# Patient Record
Sex: Male | Born: 1965 | Race: White | Hispanic: No | Marital: Married | Smoking: Current every day smoker
Health system: Southern US, Community
[De-identification: ages and names within clinical notes are randomized; demographics above are authoritative.]

## PROBLEM LIST (undated history)

## (undated) DIAGNOSIS — N200 Calculus of kidney: Secondary | ICD-10-CM

## (undated) DIAGNOSIS — I82409 Acute embolism and thrombosis of unspecified deep veins of unspecified lower extremity: Secondary | ICD-10-CM

## (undated) HISTORY — PX: ROTATOR CUFF REPAIR: SHX139

---

## 1998-08-07 ENCOUNTER — Emergency Department (HOSPITAL_COMMUNITY): Admission: EM | Admit: 1998-08-07 | Discharge: 1998-08-07 | Payer: Self-pay | Admitting: Emergency Medicine

## 1999-10-18 ENCOUNTER — Emergency Department (HOSPITAL_COMMUNITY): Admission: EM | Admit: 1999-10-18 | Discharge: 1999-10-18 | Payer: Self-pay | Admitting: *Deleted

## 1999-10-23 ENCOUNTER — Encounter: Payer: Self-pay | Admitting: Emergency Medicine

## 1999-10-23 ENCOUNTER — Observation Stay (HOSPITAL_COMMUNITY): Admission: EM | Admit: 1999-10-23 | Discharge: 1999-10-24 | Payer: Self-pay | Admitting: Emergency Medicine

## 2000-04-12 ENCOUNTER — Encounter: Payer: Self-pay | Admitting: Family Medicine

## 2000-04-12 ENCOUNTER — Ambulatory Visit (HOSPITAL_COMMUNITY): Admission: RE | Admit: 2000-04-12 | Discharge: 2000-04-12 | Payer: Self-pay | Admitting: Family Medicine

## 2001-04-09 ENCOUNTER — Encounter: Admission: RE | Admit: 2001-04-09 | Discharge: 2001-04-09 | Payer: Self-pay | Admitting: Urology

## 2001-04-09 ENCOUNTER — Encounter: Payer: Self-pay | Admitting: Urology

## 2001-04-14 ENCOUNTER — Encounter: Payer: Self-pay | Admitting: Emergency Medicine

## 2001-04-14 ENCOUNTER — Emergency Department (HOSPITAL_COMMUNITY): Admission: EM | Admit: 2001-04-14 | Discharge: 2001-04-15 | Payer: Self-pay | Admitting: Emergency Medicine

## 2002-07-29 ENCOUNTER — Emergency Department (HOSPITAL_COMMUNITY): Admission: EM | Admit: 2002-07-29 | Discharge: 2002-07-29 | Payer: Self-pay | Admitting: Emergency Medicine

## 2003-04-25 ENCOUNTER — Ambulatory Visit (HOSPITAL_BASED_OUTPATIENT_CLINIC_OR_DEPARTMENT_OTHER): Admission: RE | Admit: 2003-04-25 | Discharge: 2003-04-25 | Payer: Self-pay | Admitting: Family Medicine

## 2003-09-22 ENCOUNTER — Ambulatory Visit (HOSPITAL_COMMUNITY): Admission: RE | Admit: 2003-09-22 | Discharge: 2003-09-22 | Payer: Self-pay | Admitting: Neurological Surgery

## 2004-03-06 ENCOUNTER — Ambulatory Visit (HOSPITAL_COMMUNITY): Admission: RE | Admit: 2004-03-06 | Discharge: 2004-03-06 | Payer: Self-pay | Admitting: Emergency Medicine

## 2004-04-21 ENCOUNTER — Ambulatory Visit (HOSPITAL_COMMUNITY): Admission: RE | Admit: 2004-04-21 | Discharge: 2004-04-21 | Payer: Self-pay | Admitting: Sports Medicine

## 2005-06-04 ENCOUNTER — Encounter: Admission: RE | Admit: 2005-06-04 | Discharge: 2005-06-04 | Payer: Self-pay | Admitting: Neurology

## 2006-02-08 ENCOUNTER — Emergency Department (HOSPITAL_COMMUNITY): Admission: EM | Admit: 2006-02-08 | Discharge: 2006-02-08 | Payer: Self-pay | Admitting: Emergency Medicine

## 2006-10-30 ENCOUNTER — Ambulatory Visit: Payer: Self-pay | Admitting: Family Medicine

## 2006-10-31 ENCOUNTER — Ambulatory Visit: Payer: Self-pay | Admitting: Family Medicine

## 2006-11-06 ENCOUNTER — Ambulatory Visit (HOSPITAL_COMMUNITY): Admission: RE | Admit: 2006-11-06 | Discharge: 2006-11-06 | Payer: Self-pay | Admitting: Family Medicine

## 2006-11-06 ENCOUNTER — Ambulatory Visit: Payer: Self-pay | Admitting: Vascular Surgery

## 2007-08-24 ENCOUNTER — Ambulatory Visit (HOSPITAL_COMMUNITY): Admission: RE | Admit: 2007-08-24 | Discharge: 2007-08-24 | Payer: Self-pay | Admitting: Family Medicine

## 2007-08-28 ENCOUNTER — Ambulatory Visit (HOSPITAL_COMMUNITY): Admission: RE | Admit: 2007-08-28 | Discharge: 2007-08-28 | Payer: Self-pay | Admitting: Family Medicine

## 2007-08-28 ENCOUNTER — Ambulatory Visit: Payer: Self-pay | Admitting: Surgery

## 2007-08-28 ENCOUNTER — Encounter (INDEPENDENT_AMBULATORY_CARE_PROVIDER_SITE_OTHER): Payer: Self-pay | Admitting: Family Medicine

## 2007-10-08 ENCOUNTER — Emergency Department (HOSPITAL_COMMUNITY): Admission: EM | Admit: 2007-10-08 | Discharge: 2007-10-08 | Payer: Self-pay | Admitting: Emergency Medicine

## 2007-12-28 ENCOUNTER — Ambulatory Visit (HOSPITAL_COMMUNITY): Admission: RE | Admit: 2007-12-28 | Discharge: 2007-12-28 | Payer: Self-pay | Admitting: Family Medicine

## 2007-12-28 ENCOUNTER — Ambulatory Visit: Payer: Self-pay | Admitting: Vascular Surgery

## 2007-12-28 ENCOUNTER — Encounter (INDEPENDENT_AMBULATORY_CARE_PROVIDER_SITE_OTHER): Payer: Self-pay | Admitting: Family Medicine

## 2009-02-19 ENCOUNTER — Encounter: Admission: RE | Admit: 2009-02-19 | Discharge: 2009-02-19 | Payer: Self-pay | Admitting: Chiropractor

## 2009-05-08 ENCOUNTER — Emergency Department (HOSPITAL_BASED_OUTPATIENT_CLINIC_OR_DEPARTMENT_OTHER): Admission: EM | Admit: 2009-05-08 | Discharge: 2009-05-08 | Payer: Self-pay | Admitting: Emergency Medicine

## 2011-06-03 LAB — COMPREHENSIVE METABOLIC PANEL
Alkaline Phosphatase: 54
BUN: 7
CO2: 29
GFR calc Af Amer: >60
Potassium: 4.4

## 2011-06-03 LAB — RAPID URINE DRUG SCREEN, HOSP PERFORMED
Barbiturates: NOT DETECTED
Benzodiazepines: POSITIVE — AB
Cocaine: NOT DETECTED
Opiates: NOT DETECTED

## 2011-06-03 LAB — DIFFERENTIAL
Basophils Absolute: 0.1
Basophils Relative: 1
Eosinophils Absolute: 0.2
Lymphocytes Relative: 21
Lymphs Abs: 1.9
Monocytes Absolute: 0.5
Monocytes Relative: 6

## 2011-06-03 LAB — CBC
MCHC: 34.5
RDW: 14

## 2011-06-03 LAB — URINALYSIS, ROUTINE W REFLEX MICROSCOPIC
Bilirubin Urine: NEGATIVE
Glucose, UA: NEGATIVE
Hgb urine dipstick: NEGATIVE
Ketones, ur: NEGATIVE
Specific Gravity, Urine: 1.021
Urobilinogen, UA: 0.2

## 2011-08-18 ENCOUNTER — Encounter: Payer: Self-pay | Admitting: *Deleted

## 2011-08-18 ENCOUNTER — Emergency Department (HOSPITAL_COMMUNITY)
Admission: EM | Admit: 2011-08-18 | Discharge: 2011-08-18 | Disposition: A | Discharge status: Home / Self Care | Payer: 59 | Attending: Emergency Medicine | Admitting: Emergency Medicine

## 2011-08-18 DIAGNOSIS — R112 Nausea with vomiting, unspecified: Secondary | ICD-10-CM | POA: Insufficient documentation

## 2011-08-18 DIAGNOSIS — R109 Unspecified abdominal pain: Secondary | ICD-10-CM | POA: Insufficient documentation

## 2011-08-18 DIAGNOSIS — N200 Calculus of kidney: Secondary | ICD-10-CM

## 2011-08-18 HISTORY — DX: Calculus of kidney: N20.0

## 2011-08-18 HISTORY — DX: Acute embolism and thrombosis of unspecified deep veins of unspecified lower extremity: I82.409

## 2011-08-18 LAB — URINE MICROSCOPIC-ADD ON

## 2011-08-18 LAB — URINALYSIS, ROUTINE W REFLEX MICROSCOPIC
Ketones, ur: NEGATIVE mg/dL
Leukocytes, UA: NEGATIVE
Specific Gravity, Urine: 1.015 (ref 1.005–1.030)
Urobilinogen, UA: 0.2 mg/dL (ref 0.0–1.0)

## 2011-08-18 MED ORDER — ONDANSETRON HCL 4 MG/2ML IJ SOLN
4.0000 mg | Freq: Once | INTRAMUSCULAR | Status: AC
  Administered 2011-08-18: 4 mg via INTRAVENOUS
  Filled 2011-08-18: qty 2

## 2011-08-18 MED ORDER — ONDANSETRON HCL 4 MG PO TABS: 4.0000 mg | ORAL_TABLET | Freq: Four times a day (QID) | ORAL | Status: AC | PRN

## 2011-08-18 MED ORDER — KETOROLAC TROMETHAMINE 30 MG/ML IJ SOLN
30.0000 mg | Freq: Once | INTRAMUSCULAR | Status: AC
  Administered 2011-08-18: 30 mg via INTRAVENOUS
  Filled 2011-08-18: qty 1

## 2011-08-18 MED ORDER — SODIUM CHLORIDE 0.9 % IV BOLUS (SEPSIS)
1000.0000 mL | Freq: Once | INTRAVENOUS | Status: AC
  Administered 2011-08-18: 1000 mL via INTRAVENOUS

## 2011-08-18 MED ORDER — HYDROMORPHONE HCL PF 1 MG/ML IJ SOLN: 1.0000 mg | Freq: Once | INTRAMUSCULAR | Status: DC

## 2011-08-18 MED ORDER — OXYCODONE-ACETAMINOPHEN 5-325 MG PO TABS: 1.0000 | ORAL_TABLET | Freq: Four times a day (QID) | ORAL | Status: AC | PRN

## 2011-08-18 MED ORDER — IBUPROFEN 800 MG PO TABS: 800.0000 mg | ORAL_TABLET | Freq: Three times a day (TID) | ORAL | Status: AC

## 2011-08-18 MED ORDER — HYDROMORPHONE HCL PF 2 MG/ML IJ SOLN
INTRAMUSCULAR | Status: AC
  Administered 2011-08-18: 1 mg
  Filled 2011-08-18: qty 1

## 2011-08-18 NOTE — ED Provider Notes (Signed)
History     CSN: [id number] Arrival date & time: 08/18/2011  8:20 AM   First MD Initiated Contact with Patient 08/18/11 [phone]      Chief Complaint  Patient presents with  . Nephrolithiasis    (Consider location/radiation/quality/duration/timing/severity/associated sxs/prior treatment) Patient is a 45 y.o. male presenting with flank pain. The history is provided by the patient.  Flank Pain This is a new problem. The current episode started in the past 7 days. The problem occurs constantly. The problem has been waxing and waning. Associated symptoms include nausea and vomiting. Pertinent negatives include no abdominal pain, chest pain, chills, coughing, fever, headaches, neck pain, rash, sore throat or weakness. The symptoms are aggravated by nothing. Treatments tried: Hydrocodone, baclofen, Ultram.   age with history of multiple kidney stones over the last 20 years. Last kidney stone was more than 2 years ago. States this feels exactly like prior kidney stones. He has seen Dr Isabel Caprice in the past.  Past Medical History  Diagnosis Date  . Kidney stones   . DVT (deep venous thrombosis)     Past Surgical History  Procedure Date  . Rotator cuff repair     No family history on file.  History  Substance Use Topics  . Smoking status: Current Everyday Smoker  . Smokeless tobacco: Not on file  . Alcohol Use: No      Review of Systems  Constitutional: Negative for fever and chills.  HENT: Positive for tinnitus. Negative for sore throat, neck pain and neck stiffness.   Eyes: Negative for pain and visual disturbance.  Respiratory: Negative for cough and shortness of breath.   Cardiovascular: Negative for chest pain and palpitations.  Gastrointestinal: Positive for nausea and vomiting. Negative for abdominal pain, diarrhea and constipation.  Genitourinary: Positive for flank pain and difficulty urinating. Negative for dysuria and hematuria.  Musculoskeletal: Negative for back pain and  gait problem.  Skin: Negative for rash.  Neurological: Negative for syncope, weakness, light-headedness and headaches.  Psychiatric/Behavioral: Negative for behavioral problems and confusion.    Allergies  Penicillins  Home Medications   Current Outpatient Rx  Name Route Sig Dispense Refill  . ACETAMINOPHEN 500 MG PO TABS Oral Take 500 mg by mouth every 6 (six) hours as needed. For pain with tramadol     . BACLOFEN 10 MG PO TABS Oral Take 10 mg by mouth 3 (three) times daily as needed. For pain     . CLONAZEPAM 0.5 MG PO TABS Oral Take 0.5 mg by mouth 2 (two) times daily as needed. For anxiety     . HYDROCODONE-ACETAMINOPHEN 7.5-750 MG PO TABS Oral Take 1 tablet by mouth every 6 (six) hours as needed. For pain     . IBUPROFEN 200 MG PO TABS Oral Take 400-600 mg by mouth every 6 (six) hours as needed. For pain     . QUINAPRIL HCL 20 MG PO TABS Oral Take 20 mg by mouth daily.      . TRAMADOL HCL 50 MG PO TABS Oral Take 50-100 mg by mouth every 6 (six) hours as needed. For pain.Maximum dose= 8 tablets per day     . ZOLPIDEM TARTRATE 10 MG PO TABS Oral Take 10 mg by mouth at bedtime as needed. For sleep       BP 133/73  Pulse 85  Temp(Src) 98.5 F (36.9 C) (Oral)  Resp 20  SpO2 97%  Physical Exam  Nursing note and vitals reviewed. Constitutional: He is oriented to person, place,  and time. He appears well-developed and well-nourished.       Uncomfortable appearing  HENT:  Head: Normocephalic and atraumatic.  Right Ear: External ear normal.  Left Ear: External ear normal.  Mouth/Throat: Oropharynx is clear and moist.  Eyes: EOM are normal. Pupils are equal, round, and reactive to light.  Neck: Normal range of motion. Neck supple.  Cardiovascular: Normal rate, regular rhythm, normal heart sounds and intact distal pulses.   Pulmonary/Chest: Effort normal and breath sounds normal. He exhibits no tenderness.       Normal respiratory effort and excursion  Abdominal: Soft. He exhibits  no distension. There is no tenderness.  Musculoskeletal: Normal range of motion. He exhibits no edema and no tenderness.  Neurological: He is alert and oriented to person, place, and time.  Skin: Skin is warm and dry. No rash noted.  Psychiatric: He has a normal mood and affect. His behavior is normal.    ED Course  Procedures (including critical care time)  Labs Reviewed  URINALYSIS, ROUTINE W REFLEX MICROSCOPIC - Abnormal; Notable for the following:    Hgb urine dipstick MODERATE (*)    All other components within normal limits  URINE MICROSCOPIC-ADD ON - Abnormal; Notable for the following:    Squamous Epithelial / LPF FEW (*)    Bacteria, UA FEW (*)    All other components within normal limits  I-STAT, CHEM 8   No results found.   1. Right kidney stone       MDM  Patient with history of kidney stones presents with symptoms of same. His urine shows hematuria. As he has had multiple CT scans in the past, we will not subject him to additional radiation for a CT scan today we will treat him for a kidney stone. His i-STAT was reviewed and shows no renal insufficiency. He has been given Toradol in addition to an antiemetic. I will send him home with pain and nausea medication as well as high-dose ibuprofen and advised urology followup. The patient is agreeable with this plan.        Brian Vargas [place], [place] 08/18/11 1401

## 2011-08-18 NOTE — ED Provider Notes (Signed)
Complains of right flank pain onset 2 days ago. Feels like kidney stone he's hadhad in the past. On exam, mildly uncomfortable nontoxic  no flank tenderness, no abnormal tenderness  Doug Sou, MD 08/18/11 1106

## 2011-08-18 NOTE — ED Notes (Signed)
Pt reports kidney stone, R sided pain began Tuesday night. Hx of stones. Decreased urine output, denies blood in urine. Reports vomiting x5 in last 5 hours, cannot keep water down.

## 2011-08-18 NOTE — ED Provider Notes (Signed)
Medical screening examination/treatment/procedure(s) were conducted as a shared visit with non-physician practitioner(s) and myself.  I personally evaluated the patient during the encounter  Doug Sou, MD 08/18/11 1645

## 2011-08-23 LAB — POCT I-STAT, CHEM 8
BUN: 10 mg/dL (ref 6–23)
Calcium, Ion: 1.2 mmol/L (ref 1.12–1.32)
Creatinine, Ser: 1.1 mg/dL (ref 0.50–1.35)
Hemoglobin: 16.3 g/dL (ref 13.0–17.0)
TCO2: 28 mmol/L (ref 0–100)

## 2012-09-25 ENCOUNTER — Encounter (HOSPITAL_COMMUNITY): Payer: Self-pay | Admitting: Emergency Medicine

## 2012-09-25 ENCOUNTER — Emergency Department (HOSPITAL_COMMUNITY)
Admission: EM | Admit: 2012-09-25 | Discharge: 2012-09-25 | Disposition: A | Discharge status: Home / Self Care | Payer: 59 | Attending: Emergency Medicine | Admitting: Emergency Medicine

## 2012-09-25 DIAGNOSIS — R110 Nausea: Secondary | ICD-10-CM | POA: Insufficient documentation

## 2012-09-25 DIAGNOSIS — Z87442 Personal history of urinary calculi: Secondary | ICD-10-CM | POA: Insufficient documentation

## 2012-09-25 DIAGNOSIS — Z86718 Personal history of other venous thrombosis and embolism: Secondary | ICD-10-CM | POA: Insufficient documentation

## 2012-09-25 DIAGNOSIS — Z87828 Personal history of other (healed) physical injury and trauma: Secondary | ICD-10-CM | POA: Insufficient documentation

## 2012-09-25 DIAGNOSIS — M549 Dorsalgia, unspecified: Secondary | ICD-10-CM | POA: Insufficient documentation

## 2012-09-25 DIAGNOSIS — G8929 Other chronic pain: Secondary | ICD-10-CM | POA: Insufficient documentation

## 2012-09-25 DIAGNOSIS — R300 Dysuria: Secondary | ICD-10-CM | POA: Insufficient documentation

## 2012-09-25 DIAGNOSIS — Z79899 Other long term (current) drug therapy: Secondary | ICD-10-CM | POA: Insufficient documentation

## 2012-09-25 DIAGNOSIS — F17200 Nicotine dependence, unspecified, uncomplicated: Secondary | ICD-10-CM | POA: Insufficient documentation

## 2012-09-25 LAB — URINALYSIS, MICROSCOPIC ONLY
Hgb urine dipstick: NEGATIVE
Specific Gravity, Urine: 1.012 (ref 1.005–1.030)
Urobilinogen, UA: 0.2 mg/dL (ref 0.0–1.0)
pH: 6.5 (ref 5.0–8.0)

## 2012-09-25 LAB — CBC WITH DIFFERENTIAL/PLATELET
Basophils Absolute: 0 10*3/uL (ref 0.0–0.1)
Basophils Relative: 0 % (ref 0–1)
Eosinophils Absolute: 0.3 10*3/uL (ref 0.0–0.7)
Eosinophils Relative: 3 % (ref 0–5)
MCH: 29.9 pg (ref 26.0–34.0)
MCV: 89.1 fL (ref 78.0–100.0)
Neutrophils Relative %: 57 % (ref 43–77)
Platelets: 251 10*3/uL (ref 150–400)
RDW: 13.8 % (ref 11.5–15.5)

## 2012-09-25 LAB — COMPREHENSIVE METABOLIC PANEL
ALT: 11 U/L (ref 0–53)
AST: 11 U/L (ref 0–37)
Albumin: 3.4 g/dL — ABNORMAL LOW (ref 3.5–5.2)
Calcium: 8.9 mg/dL (ref 8.4–10.5)
GFR calc Af Amer: >90 mL/min (ref >90)
Glucose, Bld: 87 mg/dL (ref 70–99)
Sodium: 139 mEq/L (ref 135–145)
Total Protein: 6 g/dL (ref 6.0–8.3)

## 2012-09-25 MED ORDER — KETOROLAC TROMETHAMINE 30 MG/ML IJ SOLN
30.0000 mg | Freq: Once | INTRAMUSCULAR | Status: AC
  Administered 2012-09-25: 30 mg via INTRAVENOUS
  Filled 2012-09-25: qty 1

## 2012-09-25 MED ORDER — OXYCODONE-ACETAMINOPHEN 5-325 MG PO TABS: 2.0000 | ORAL_TABLET | Freq: Four times a day (QID) | ORAL | Status: AC | PRN

## 2012-09-25 MED ORDER — OXYCODONE-ACETAMINOPHEN 5-325 MG PO TABS
2.0000 | ORAL_TABLET | Freq: Once | ORAL | Status: AC
  Administered 2012-09-25: 2 via ORAL
  Filled 2012-09-25: qty 2

## 2012-09-25 MED ORDER — SODIUM CHLORIDE 0.9 % IV SOLN
1000.0000 mL | Freq: Once | INTRAVENOUS | Status: AC
  Administered 2012-09-25: 1000 mL via INTRAVENOUS

## 2012-09-25 MED ORDER — HYDROMORPHONE HCL PF 1 MG/ML IJ SOLN
1.0000 mg | Freq: Once | INTRAMUSCULAR | Status: AC
  Administered 2012-09-25: 1 mg via INTRAVENOUS
  Filled 2012-09-25: qty 1

## 2012-09-25 MED ORDER — ONDANSETRON HCL 4 MG/2ML IJ SOLN
4.0000 mg | Freq: Once | INTRAMUSCULAR | Status: AC
  Administered 2012-09-25: 4 mg via INTRAVENOUS
  Filled 2012-09-25: qty 2

## 2012-09-25 NOTE — ED Provider Notes (Signed)
History     CSN: [id number]  Arrival date & time 09/25/12  1156   First MD Initiated Contact with Patient 09/25/12 1233      Chief Complaint  Patient presents with  . Flank Pain    (Consider location/radiation/quality/duration/timing/severity/associated sxs/prior treatment) HPI Comments: This is a 47 year old male, PMH of chronic back pain and kidney stones, who presents emergency department with chief complaint of flank pain. Patient states that his pain began yesterday.  He denies any MOI.  He states that his pain is 9/10.  The pain does not radiate to his legs, he denies any bowel, or bladder incontinence.  There is no saddle anesthesia.  The patient has tried taking vicodin with some relief.  He states that he has had some dysuria.  The history is provided by the patient. No language interpreter was used.    Past Medical History  Diagnosis Date  . Kidney stones   . DVT (deep venous thrombosis)     Past Surgical History  Procedure Date  . Rotator cuff repair     No family history on file.  History  Substance Use Topics  . Smoking status: Current Every Day Smoker -- 0.5 packs/day    Types: Cigarettes  . Smokeless tobacco: Not on file  . Alcohol Use: No      Review of Systems  All other systems reviewed and are negative.    Allergies  Penicillins  Home Medications   Current Outpatient Rx  Name  Route  Sig  Dispense  Refill  . ACETAMINOPHEN 500 MG PO TABS   Oral   Take 500 mg by mouth every 6 (six) hours as needed. For pain with tramadol          . BACLOFEN 10 MG PO TABS   Oral   Take 10 mg by mouth 3 (three) times daily as needed. For pain          . CLONAZEPAM 0.5 MG PO TABS   Oral   Take 0.5 mg by mouth 2 (two) times daily as needed. For anxiety          . HYDROCODONE-ACETAMINOPHEN 7.5-750 MG PO TABS   Oral   Take 1 tablet by mouth every 6 (six) hours as needed. For pain          . IBUPROFEN 200 MG PO TABS   Oral   Take 400-600 mg by  mouth every 6 (six) hours as needed. For pain          . PANTOPRAZOLE SODIUM 40 MG PO TBEC   Oral   Take 40 mg by mouth daily.         Marland Kitchen PREDNISONE (PAK) PO   Oral   Take 1 tablet by mouth daily. Finished on 09-24-12 for 7 day therapy         . QUINAPRIL HCL 20 MG PO TABS   Oral   Take 20 mg by mouth daily.           . TRAMADOL HCL 50 MG PO TABS   Oral   Take 50-100 mg by mouth every 6 (six) hours as needed. For pain.Maximum dose= 8 tablets per day          . ZOLPIDEM TARTRATE 10 MG PO TABS   Oral   Take 10 mg by mouth at bedtime as needed. For sleep            BP 146/91  Pulse 94  Temp 98.6 F (37  C) (Oral)  Resp 20  SpO2 95%  Physical Exam  Nursing note and vitals reviewed. Constitutional: He is oriented to person, place, and time. He appears well-developed and well-nourished.  HENT:  Head: Normocephalic and atraumatic.  Right Ear: External ear normal.  Left Ear: External ear normal.  Nose: Nose normal.  Mouth/Throat: Oropharynx is clear and moist. No oropharyngeal exudate.  Eyes: Conjunctivae normal and EOM are normal. Pupils are equal, round, and reactive to light. Right eye exhibits no discharge. Left eye exhibits no discharge. No scleral icterus.  Neck: Normal range of motion. Neck supple. No JVD present.  Cardiovascular: Normal rate, regular rhythm, normal heart sounds and intact distal pulses.  Exam reveals no gallop and no friction rub.   No murmur heard. Pulmonary/Chest: Effort normal and breath sounds normal. No respiratory distress. He has no wheezes. He has no rales. He exhibits no tenderness.  Abdominal: Soft. Bowel sounds are normal. He exhibits no distension and no mass. There is no tenderness. There is no rebound and no guarding.       No CVA tenderness  Musculoskeletal: Normal range of motion. He exhibits no edema and no tenderness.       Tenderness to palpation over lumbar paraspinal muscles,   Neurological: He is alert and oriented to  person, place, and time. He has normal reflexes.  Skin: Skin is warm and dry.  Psychiatric: He has a normal mood and affect. His behavior is normal. Judgment and thought content normal.    ED Course  Procedures (including critical care time)   Labs Reviewed  CBC WITH DIFFERENTIAL  COMPREHENSIVE METABOLIC PANEL  LIPASE, BLOOD  URINALYSIS, MICROSCOPIC ONLY   Results for orders placed during the hospital encounter of 09/25/12  CBC WITH DIFFERENTIAL      Component Value Range   WBC 11.6 (*) 4.0 - 10.5 K/uL   RBC 4.48  4.22 - 5.81 MIL/uL   Hemoglobin 13.4  13.0 - 17.0 g/dL   HCT 45.4  09.8 - 11.9 %   MCV 89.1  78.0 - 100.0 fL   MCH 29.9  26.0 - 34.0 pg   MCHC 33.6  30.0 - 36.0 g/dL   RDW 14.7  82.9 - 56.2 %   Platelets 251  150 - 400 K/uL   Neutrophils Relative 57  43 - 77 %   Neutro Abs 6.7  1.7 - 7.7 K/uL   Lymphocytes Relative 31  12 - 46 %   Lymphs Abs 3.6  0.7 - 4.0 K/uL   Monocytes Relative 9  3 - 12 %   Monocytes Absolute 1.0  0.1 - 1.0 K/uL   Eosinophils Relative 3  0 - 5 %   Eosinophils Absolute 0.3  0.0 - 0.7 K/uL   Basophils Relative 0  0 - 1 %   Basophils Absolute 0.0  0.0 - 0.1 K/uL  COMPREHENSIVE METABOLIC PANEL      Component Value Range   Sodium 139  135 - 145 mEq/L   Potassium 3.9  3.5 - 5.1 mEq/L   Chloride 101  96 - 112 mEq/L   CO2 32  19 - 32 mEq/L   Glucose, Bld 87  70 - 99 mg/dL   BUN 13  6 - 23 mg/dL   Creatinine, Ser 1.30  0.50 - 1.35 mg/dL   Calcium 8.9  8.4 - 86.5 mg/dL   Total Protein 6.0  6.0 - 8.3 g/dL   Albumin 3.4 (*) 3.5 - 5.2 g/dL   AST 11  0 - 37 U/L   ALT 11  0 - 53 U/L   Alkaline Phosphatase 42  39 - 117 U/L   Total Bilirubin 0.2 (*) 0.3 - 1.2 mg/dL   GFR calc non Af Amer >90  >90 mL/min   GFR calc Af Amer >90  >90 mL/min  LIPASE, BLOOD      Component Value Range   Lipase 13  11 - 59 U/L  URINALYSIS, MICROSCOPIC ONLY      Component Value Range   Color, Urine YELLOW  YELLOW   APPearance CLEAR  CLEAR   Specific Gravity, Urine  1.012  1.005 - 1.030   pH 6.5  5.0 - 8.0   Glucose, UA NEGATIVE  NEGATIVE mg/dL   Hgb urine dipstick NEGATIVE  NEGATIVE   Bilirubin Urine NEGATIVE  NEGATIVE   Ketones, ur NEGATIVE  NEGATIVE mg/dL   Protein, ur NEGATIVE  NEGATIVE mg/dL   Urobilinogen, UA 0.2  0.0 - 1.0 mg/dL   Nitrite NEGATIVE  NEGATIVE   Leukocytes, UA NEGATIVE  NEGATIVE   WBC, UA 0-2  <3 WBC/hpf   RBC / HPF 0-2  <3 RBC/hpf   Bacteria, UA RARE  RARE   Squamous Epithelial / LPF RARE  RARE       1. Back pain       MDM  This is a 47 year old male, who presents to the ED with back pain vs kidney stone.  Will give the patient toradol and re-evaluate.  Will obtain UA, and look for any signs of kidney stones.  3:53 PM Patient feeling much better. Will discharge to home with some medicine, and recommend primary care followup. Patient understands and agrees with the plan. He is stable and ready for discharge.        Roxy Horseman, PA-C 09/25/12 1554

## 2012-09-25 NOTE — ED Notes (Signed)
Pt presenting to ed with c/o left side flank pain that's radiating to the right side. Pt states history of kidney stones and he broke his back when he was 19 so he's unsure of what the pain is. Pt denies hematuria and dysuria at this time. Pt state onset yesterday. Pt states positive nausea no vomiting

## 2012-09-27 ENCOUNTER — Emergency Department (HOSPITAL_COMMUNITY)
Admission: EM | Admit: 2012-09-27 | Discharge: 2012-09-27 | Disposition: A | Discharge status: Home / Self Care | Payer: 59 | Attending: Emergency Medicine | Admitting: Emergency Medicine

## 2012-09-27 DIAGNOSIS — M545 Low back pain, unspecified: Secondary | ICD-10-CM | POA: Insufficient documentation

## 2012-09-27 DIAGNOSIS — Z79899 Other long term (current) drug therapy: Secondary | ICD-10-CM | POA: Insufficient documentation

## 2012-09-27 DIAGNOSIS — Z86718 Personal history of other venous thrombosis and embolism: Secondary | ICD-10-CM | POA: Insufficient documentation

## 2012-09-27 DIAGNOSIS — F17200 Nicotine dependence, unspecified, uncomplicated: Secondary | ICD-10-CM | POA: Insufficient documentation

## 2012-09-27 DIAGNOSIS — G8929 Other chronic pain: Secondary | ICD-10-CM | POA: Insufficient documentation

## 2012-09-27 DIAGNOSIS — Z9181 History of falling: Secondary | ICD-10-CM | POA: Insufficient documentation

## 2012-09-27 DIAGNOSIS — Z87442 Personal history of urinary calculi: Secondary | ICD-10-CM | POA: Insufficient documentation

## 2012-09-27 DIAGNOSIS — M549 Dorsalgia, unspecified: Secondary | ICD-10-CM

## 2012-09-27 NOTE — ED Provider Notes (Signed)
Medical screening examination/treatment/procedure(s) were performed by non-physician practitioner and as supervising physician I was immediately available for consultation/collaboration.  Taytem Ghattas T Shota Kohrs, MD 09/27/12 0752 

## 2012-09-27 NOTE — ED Notes (Signed)
Pt c/o lower back pain for 2 days.  No injury reported.   Denies numbness or tingling in legs or toes.

## 2012-09-27 NOTE — ED Provider Notes (Signed)
History  Scribed for Elpidio Anis, PA-C/ Dione Booze, MD, the patient was seen in room WTR6/WTR6. This chart was scribed by Candelaria Stagers. The patient's care started at 10:21 PM   CSN: [id number]  Arrival date & time 09/27/12  2127   First MD Initiated Contact with Patient 09/27/12 2154      No chief complaint on file.    The history is provided by the patient. No language interpreter was used.   Brian Vargas is a 47 y.o. male who presents to the Emergency Department complaining of chronic lower back pain that became exacerbated about one week ago.  Pt denies injury or recent heavy lifting.  He has h/o 38 ft fall.  Pt has taken oxycodone with mild relief.  He also takes baclofen and tramadol for his chronic back pain.  Pt has an appointment later this month with an orthopaedist.  Pt was in the ED two days ago for his back pain.     Past Medical History  Diagnosis Date  . Kidney stones   . DVT (deep venous thrombosis)     Past Surgical History  Procedure Date  . Rotator cuff repair     No family history on file.  History  Substance Use Topics  . Smoking status: Current Every Day Smoker -- 0.5 packs/day    Types: Cigarettes  . Smokeless tobacco: Not on file  . Alcohol Use: No      Review of Systems  Constitutional: Negative for fever and chills.  Respiratory: Negative for shortness of breath.   Gastrointestinal: Negative for nausea and vomiting.  Musculoskeletal: Positive for back pain (lower back pain).  Neurological: Negative for weakness.  All other systems reviewed and are negative.    Allergies  Penicillins  Home Medications   Current Outpatient Rx  Name  Route  Sig  Dispense  Refill  . ACETAMINOPHEN 500 MG PO TABS   Oral   Take 500 mg by mouth every 6 (six) hours as needed. For pain with tramadol          . BACLOFEN 10 MG PO TABS   Oral   Take 10 mg by mouth 3 (three) times daily as needed. For pain          . CLONAZEPAM 0.5 MG PO TABS  Oral   Take 0.5 mg by mouth 2 (two) times daily as needed. For anxiety          . HYDROCODONE-ACETAMINOPHEN 7.5-750 MG PO TABS   Oral   Take 1 tablet by mouth every 6 (six) hours as needed. For pain          . IBUPROFEN 200 MG PO TABS   Oral   Take 400-600 mg by mouth every 6 (six) hours as needed. For pain          . OXYCODONE-ACETAMINOPHEN 5-325 MG PO TABS   Oral   Take 2 tablets by mouth every 6 (six) hours as needed for pain.   10 tablet   0   . PANTOPRAZOLE SODIUM 40 MG PO TBEC   Oral   Take 40 mg by mouth daily.         . QUINAPRIL HCL 20 MG PO TABS   Oral   Take 20 mg by mouth daily.           . TRAMADOL HCL 50 MG PO TABS   Oral   Take 50-100 mg by mouth every 6 (six) hours as needed. For pain.Maximum  dose= 8 tablets per day          . ZOLPIDEM TARTRATE 10 MG PO TABS   Oral   Take 10 mg by mouth at bedtime as needed. For sleep            BP 152/90  Pulse 107  Temp 98.6 F (37 C) (Oral)  Resp 20  Wt 212 lb (96.163 kg)  SpO2 100%  Physical Exam  Nursing note and vitals reviewed. Constitutional: He is oriented to person, place, and time. He appears well-developed and well-nourished. No distress.  HENT:  Head: Normocephalic and atraumatic.  Eyes: EOM are normal.  Neck: Neck supple. No tracheal deviation present.  Cardiovascular: Normal rate.   Pulmonary/Chest: Effort normal. No respiratory distress.  Musculoskeletal: Normal range of motion.       No swelling.  Lower thoracic and lumbar paraspinal tenderness.  No spasm.  Midline tenderness.    Neurological: He is alert and oriented to person, place, and time.       Normal reflexes.    Skin: Skin is warm and dry.  Psychiatric: His speech is slurred. He is slowed.    ED Course  Procedures   DIAGNOSTIC STUDIES: Oxygen Saturation is 100% on room air, normal by my interpretation.    COORDINATION OF CARE:  10:41 PM Advised pt to follow up with orthopaedist.  Pt understands and agrees.     Labs Reviewed - No data to display No results found.   No diagnosis found. 1. Back pain   MDM  The patient is somnolent, slurring speech, slow fine motor coordination. He appears that he is under the influence of alcohol or other substances. No medications given. Verified he has a driver that will get him home. He is ambulatory, oriented. Stable for discharge.  I personally performed the services described in this documentation, which was scribed in my presence. The recorded information has been reviewed and is accurate.         Arnoldo Hooker, PA-C 09/27/12 2328

## 2012-09-28 NOTE — ED Provider Notes (Signed)
Medical screening examination/treatment/procedure(s) were performed by non-physician practitioner and as supervising physician I was immediately available for consultation/collaboration.   Dione Booze, MD 09/28/12 [phone]

## 2015-07-31 ENCOUNTER — Emergency Department (HOSPITAL_COMMUNITY): Payer: Self-pay

## 2015-07-31 ENCOUNTER — Emergency Department (HOSPITAL_COMMUNITY)
Admission: EM | Admit: 2015-07-31 | Discharge: 2015-07-31 | Disposition: A | Discharge status: Home / Self Care | Payer: Self-pay | Attending: Emergency Medicine | Admitting: Emergency Medicine

## 2015-07-31 ENCOUNTER — Encounter (HOSPITAL_COMMUNITY): Payer: Self-pay

## 2015-07-31 DIAGNOSIS — Z880 Allergy status to penicillin: Secondary | ICD-10-CM | POA: Insufficient documentation

## 2015-07-31 DIAGNOSIS — Z86718 Personal history of other venous thrombosis and embolism: Secondary | ICD-10-CM | POA: Insufficient documentation

## 2015-07-31 DIAGNOSIS — Z87442 Personal history of urinary calculi: Secondary | ICD-10-CM | POA: Insufficient documentation

## 2015-07-31 DIAGNOSIS — R5383 Other fatigue: Secondary | ICD-10-CM | POA: Insufficient documentation

## 2015-07-31 DIAGNOSIS — Z79899 Other long term (current) drug therapy: Secondary | ICD-10-CM | POA: Insufficient documentation

## 2015-07-31 DIAGNOSIS — F17210 Nicotine dependence, cigarettes, uncomplicated: Secondary | ICD-10-CM | POA: Insufficient documentation

## 2015-07-31 DIAGNOSIS — R404 Transient alteration of awareness: Secondary | ICD-10-CM | POA: Insufficient documentation

## 2015-07-31 MED ORDER — SODIUM CHLORIDE 0.9 % IV SOLN: 1000.0000 mL | INTRAVENOUS | Status: DC

## 2015-07-31 NOTE — Discharge Instructions (Signed)
Confusion Confusion is the inability to think with your usual speed or clarity. Confusion may come on quickly or slowly over time. How quickly the confusion comes on depends on the cause. Confusion can be due to any number of causes. CAUSES   Concussion, head injury, or head trauma.  Seizures.  Stroke.  Fever.  Brain tumor.  Age related decreased brain function (dementia).  Heightened emotional states like rage or terror.  Mental illness in which the person loses the ability to determine what is real and what is not (hallucinations).  Infections such as a urinary tract infection (UTI).  Toxic effects from alcohol, drugs, or prescription medicines.  Dehydration and an imbalance of salts in the body (electrolytes).  Lack of sleep.  Low blood sugar (diabetes).  Low levels of oxygen from conditions such as chronic lung disorders.  Drug interactions or other medicine side effects.  Nutritional deficiencies, especially niacin, thiamine, vitamin C, or vitamin B.  Sudden drop in body temperature (hypothermia).  Change in routine, such as when traveling or hospitalized. SIGNS AND SYMPTOMS  People often describe their thinking as cloudy or unclear when they are confused. Confusion can also include feeling disoriented. That means you are unaware of where or who you are. You may also not know what the date or time is. If confused, you may also have difficulty paying attention, remembering, and making decisions. Some people also act aggressively when they are confused.  DIAGNOSIS  The medical evaluation of confusion may include:  Blood and urine tests.  X-rays.  Brain and nervous system tests.  Analyzing your brain waves (electroencephalogram or EEG).  Magnetic resonance imaging (MRI) of your head.  Computed tomography (CT) scan of your head.  Mental status tests in which your health care provider may ask many questions. Some of these questions may seem silly or strange,  but they are a very important test to help diagnose and treat confusion. TREATMENT  An admission to the hospital may not be needed, but a person with confusion should not be left alone. Stay with a family member or friend until the confusion clears. Avoid alcohol, pain relievers, or sedative drugs until you have fully recovered. Do not drive until directed by your health care provider. HOME CARE INSTRUCTIONS  What family and friends can do:  To find out if someone is confused, ask the person to state his or her name, age, and the date. If the person is unsure or answers incorrectly, he or she is confused.  Always introduce yourself, no matter how well the person knows you.  Often remind the person of his or her location.  Place a calendar and clock near the confused person.  Help the person with his or her medicines. You may want to use a pill box, an alarm as a reminder, or give the person each dose as prescribed.  Talk about current events and plans for the day.  Try to keep the environment calm, quiet, and peaceful.  Make sure the person keeps follow-up visits with his or her health care provider. PREVENTION  Ways to prevent confusion:  Avoid alcohol.  Eat a balanced diet.  Get enough sleep.  Take medicine only as directed by your health care provider.  Do not become isolated. Spend time with other people and make plans for your days.  Keep careful watch on your blood sugar levels if you are diabetic. SEEK IMMEDIATE MEDICAL CARE IF:   You develop severe headaches, repeated vomiting, seizures, blackouts, or   slurred speech.  There is increasing confusion, weakness, numbness, restlessness, or personality changes.  You develop a loss of balance, have marked dizziness, feel uncoordinated, or fall.  You have delusions, hallucinations, or develop severe anxiety.  Your family members think you need to be rechecked.   This information is not intended to replace advice given  to you by your health care provider. Make sure you discuss any questions you have with your health care provider.   Document Released: 10/06/2004 Document Revised: 09/19/2014 Document Reviewed: 10/04/2013 Elsevier Interactive Patient Education 2016 [employer]  

## 2015-07-31 NOTE — ED Notes (Signed)
Per RN cancel lab draw,  Pt will be discharged

## 2015-07-31 NOTE — ED Notes (Signed)
Delay in lab draw pt in bathroom 

## 2015-07-31 NOTE — ED Notes (Signed)
Bed: WA07 Expected date:  Expected time:  Means of arrival:  Comments: EMS- AMS/drug ingestion?

## 2015-07-31 NOTE — ED Notes (Signed)
Patient presents to ED by EMS after being [facility]. The patient denies using any drugs but is very drowsy and police deny the use of ETOH.

## 2015-07-31 NOTE — ED Provider Notes (Signed)
CSN: [id number]     Arrival date & time 07/31/15  1251 History   First MD Initiated Contact with Patient 07/31/15 1359     Chief Complaint  Patient presents with  . Altered Mental Status    HPI Patient presents to the emergency room for evaluation of altered mental status. Patient was brought in by EMS. Found wandering outside a [facility].  Patient was somnolent and slow to respond. Patient denies any alcohol use. He denied any drug use. Patient is still very somnolent here in the emergency room. He will wake up when spoken to loudly and with sternal rub. He denies any complaints. When I asked him why he is so sleepy he tells me he is tired. Past Medical History  Diagnosis Date  . Kidney stones   . DVT (deep venous thrombosis) [facility](HCC)    Past Surgical History  Procedure Laterality Date  . Rotator cuff repair     History reviewed. No pertinent family history. Social History  Substance Use Topics  . Smoking status: Current Every Day Smoker -- 0.50 packs/day    Types: Cigarettes  . Smokeless tobacco: None  . Alcohol Use: No    Review of Systems  All other systems reviewed and are negative.     Allergies  Penicillins  Home Medications   Prior to Admission medications   Medication Sig Start Date End Date Taking? Authorizing Provider  acetaminophen (TYLENOL) 500 MG tablet Take 500 mg by mouth every 6 (six) hours as needed. For pain with tramadol     Historical Provider, MD  baclofen (LIORESAL) 10 MG tablet Take 10 mg by mouth 3 (three) times daily as needed. For pain     Historical Provider, MD  clonazePAM (KLONOPIN) 0.5 MG tablet Take 0.5 mg by mouth 2 (two) times daily as needed. For anxiety     Historical Provider, MD  HYDROcodone-acetaminophen (VICODIN ES) 7.5-750 MG per tablet Take 1 tablet by mouth every 6 (six) hours as needed. For pain     Historical Provider, MD  ibuprofen (ADVIL,MOTRIN) 200 MG tablet Take 400-600 mg by mouth every 6 (six) hours as needed. For pain      Historical Provider, MD  oxyCODONE-acetaminophen (PERCOCET/ROXICET) 5-325 MG per tablet Take 2 tablets by mouth every 6 (six) hours as needed for pain. 09/25/12   Roxy Horsemanobert Browning, PA-C  pantoprazole (PROTONIX) 40 MG tablet Take 40 mg by mouth daily.    Historical Provider, MD  quinapril (ACCUPRIL) 20 MG tablet Take 20 mg by mouth daily.      Historical Provider, MD  traMADol (ULTRAM) 50 MG tablet Take 50-100 mg by mouth every 6 (six) hours as needed. For pain.Maximum dose= 8 tablets per day     Historical Provider, MD  zolpidem (AMBIEN) 10 MG tablet Take 10 mg by mouth at bedtime as needed. For sleep     Historical Provider, MD   BP 100/75 mmHg  Pulse 74  Temp(Src) 98.5 F (36.9 C) (Oral)  Resp 18  SpO2 96% Physical Exam  Constitutional: He appears well-developed and well-nourished. He appears listless. No distress.  HENT:  Head: Normocephalic and atraumatic.  Right Ear: External ear normal.  Left Ear: External ear normal.  Eyes: Conjunctivae are normal. Right eye exhibits no discharge. Left eye exhibits no discharge. No scleral icterus.  Neck: Neck supple. No tracheal deviation present.  Cardiovascular: Normal rate, regular rhythm and intact distal pulses.   Pulmonary/Chest: Effort normal and breath sounds normal. No stridor. No respiratory distress.  He has no wheezes. He has no rales.  Abdominal: Soft. Bowel sounds are normal. He exhibits no distension. There is no tenderness. There is no rebound and no guarding.  Musculoskeletal: He exhibits no edema or tenderness.  Neurological: He has normal strength. He appears listless. No cranial nerve deficit (no facial droop, extraocular movements intact, no slurred speech) or sensory deficit. He exhibits normal muscle tone. He displays no seizure activity. Coordination normal.  When the patient does wake up he will follow commands, no lower extremities however he quickly falls back asleep  Skin: Skin is warm and dry. No rash noted.   Psychiatric: He has a normal mood and affect.  Nursing note and vitals reviewed.   ED Course  Procedures (including critical care time) Labs Review Labs Reviewed  COMPREHENSIVE METABOLIC PANEL  CBC WITH DIFFERENTIAL/PLATELET  AMMONIA  ETHANOL  URINE RAPID DRUG SCREEN, HOSP PERFORMED  SALICYLATE LEVEL  ACETAMINOPHEN LEVEL    Imaging Review Ct Head Wo Contrast  07/31/2015  CLINICAL DATA:  "Patient presents to ED by EMS after being found wandering outside of the [facility]. The patient denies using any drugs but is very drowsy and police deny the use of ETOH." pt unable to respond to hx questions. EXAM: CT HEAD WITHOUT CONTRAST TECHNIQUE: Contiguous axial images were obtained from the base of the skull through the vertex without intravenous contrast. COMPARISON:  08/24/2007 FINDINGS: The ventricles are normal in size and configuration. There are no parenchymal masses or mass effect. There is no evidence of an infarct. There are no extra-axial masses or abnormal fluid collections. There is no intracranial hemorrhage. There is mild inferior frontal and ethmoid sinus mucosal thickening. Clear mastoid air cells. No skull fracture or lesion. IMPRESSION: 1. No intracranial abnormality. Electronically Signed   By: Amie Portland M.D.   On: 07/31/2015 14:45   I have personally reviewed and evaluated these images and lab results as part of my medical decision-making.   MDM   Final diagnoses:  Transient alteration of awareness    Patient has his medications with him. He is on several medications that could cause sedation including benzodiazepines. Denies any use today but I am suspicious that the symptoms are related to a benzodiazepine ingestion.  1542  Pt is awake and alert.  He is walking around the ED and states he needs to go home and care for a family member.  He does not appear to be sleepy at all.    I suspect his symptoms are related to some type of drug use, most likely  benzodiazepine but he denies any drug use.  At this time there does not appear to be any evidence of an acute emergency medical condition and the patient appears stable for discharge with appropriate outpatient follow up.     Linwood Dibbles, MD 07/31/15 [phone]

## 2016-09-13 IMAGING — CT CT HEAD W/O CM
2 series · 17 of 30 positions shown, 20 images · non-contrast
Comparison: 08/24/2007

CLINICAL DATA: "Patient presents to ED by EMS after being found
wandering outside of the [facility]. The patient denies using any
drugs but is very drowsy and police deny the use of ETOH." pt unable
to respond to hx questions.

EXAM:
CT HEAD WITHOUT CONTRAST
TECHNIQUE: Contiguous axial images were obtained from the base of the skull
through the vertex without intravenous contrast.

[Series 2: head w/o · axial · non-contrast · 0.45mm/px · z∈[+1292,+1412]mm · 9 of 30 slices shown, 12 images]
[im 3/30  brain]
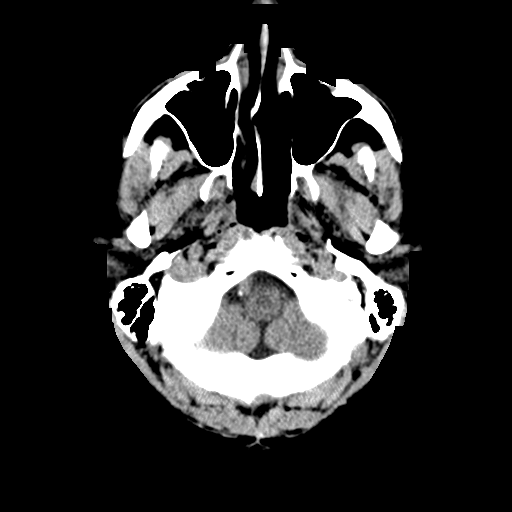
[im 3/30  bone]
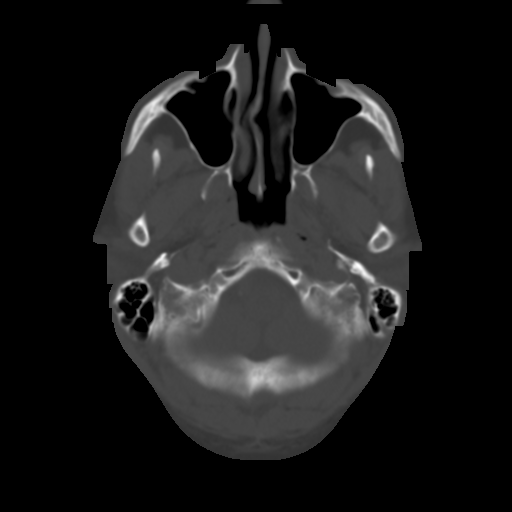
[im 6/30  brain]
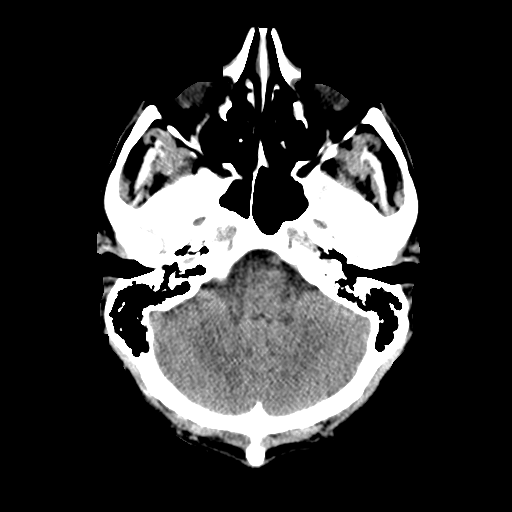
[im 9/30  brain]
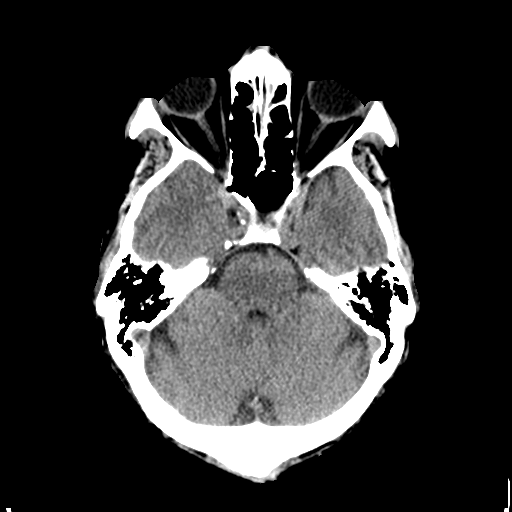
[im 12/30  brain]
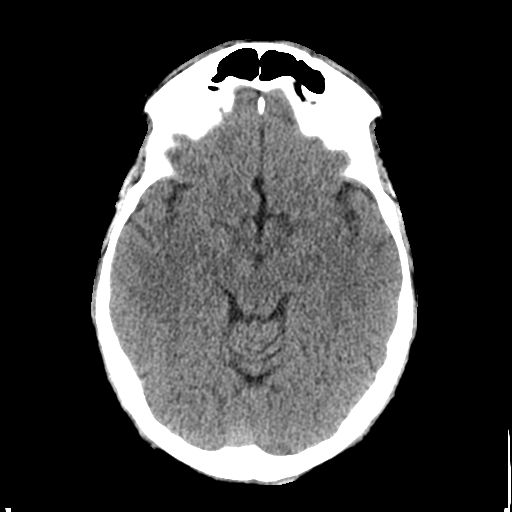
[im 15/30  brain]
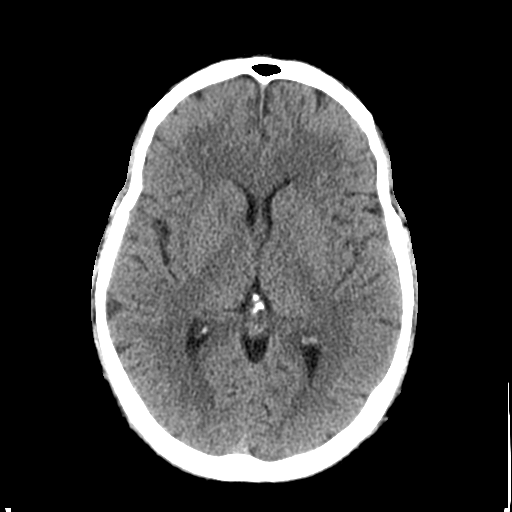
[im 15/30  bone]
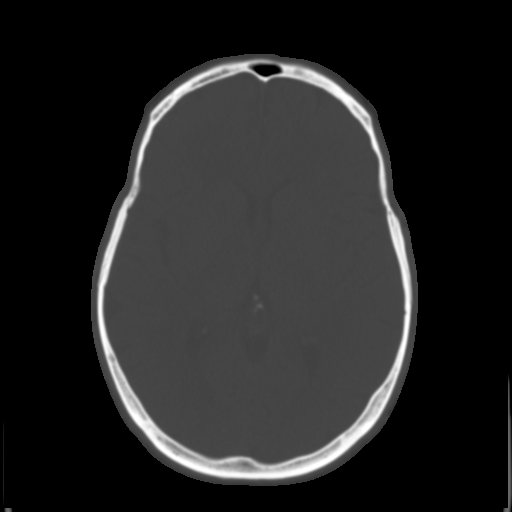
[im 18/30  brain]
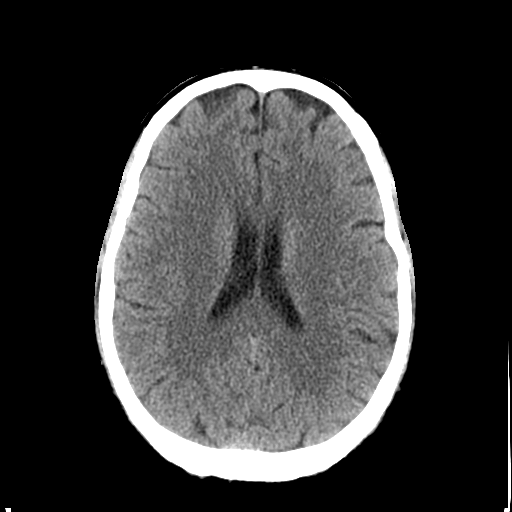
[im 21/30  brain]
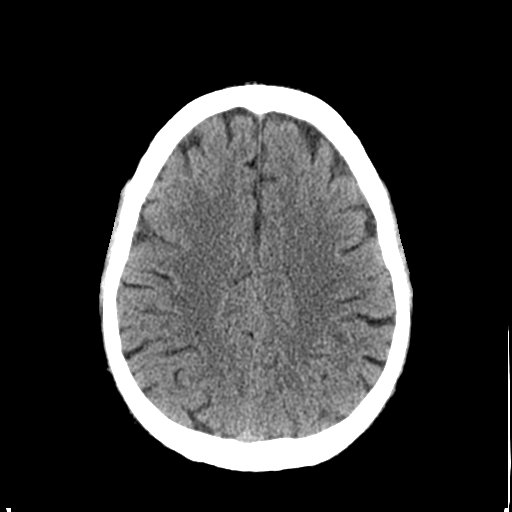
[im 24/30  brain]
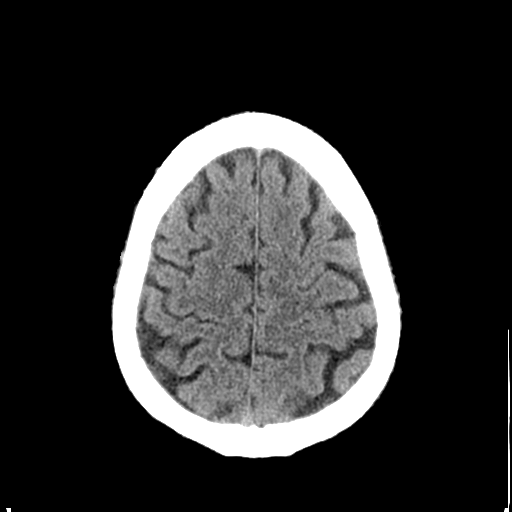
[im 27/30  brain]
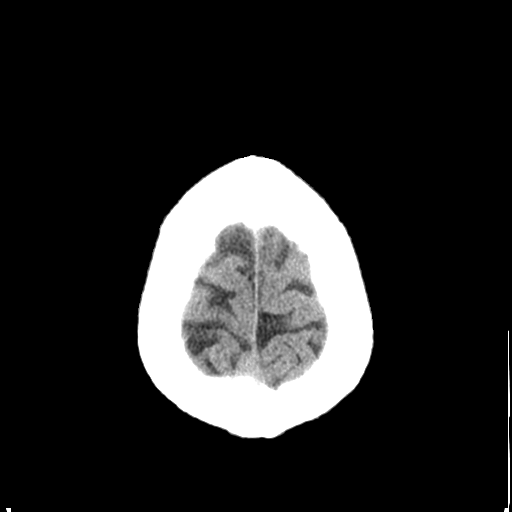
[im 27/30  bone]
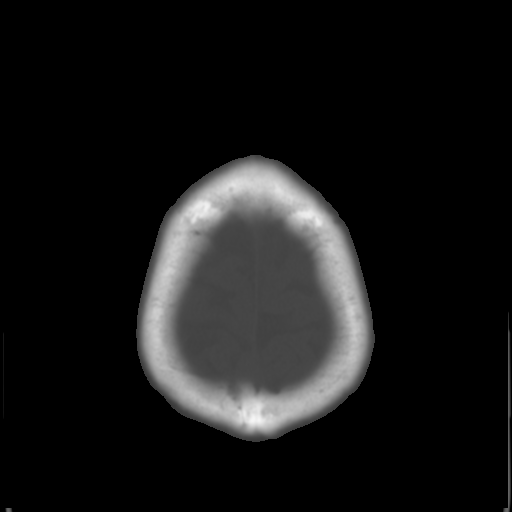

[Series 3: bone windows · axial · 0.45mm/px · z∈[+1297,+1411]mm · 8 of 50 slices shown]
[im 6/50  bone]
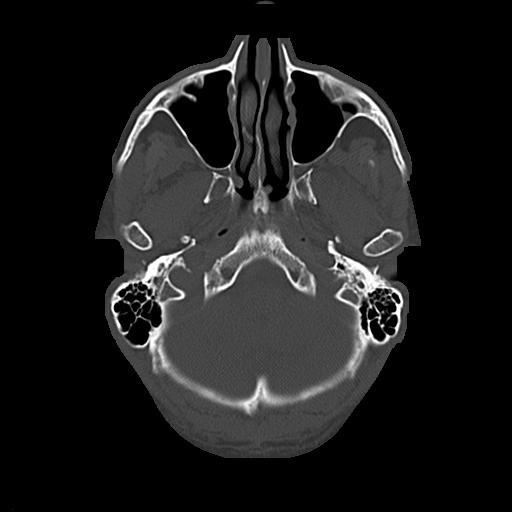
[im 11/50  bone]
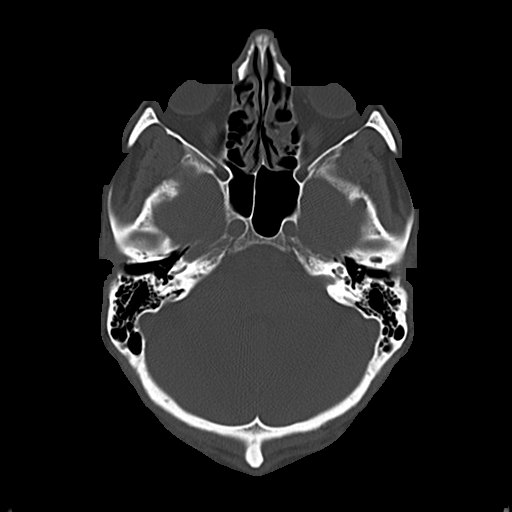
[im 17/50  bone]
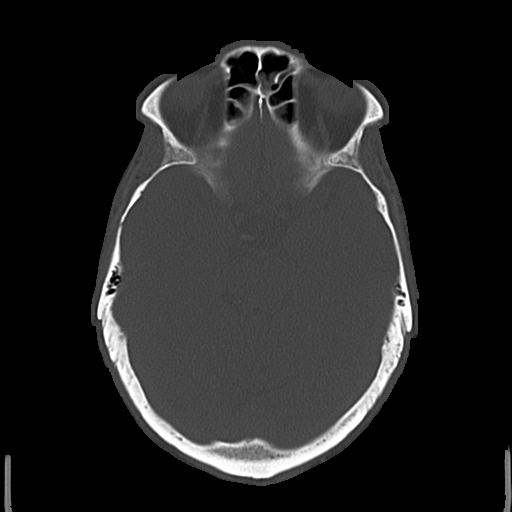
[im 22/50  bone]
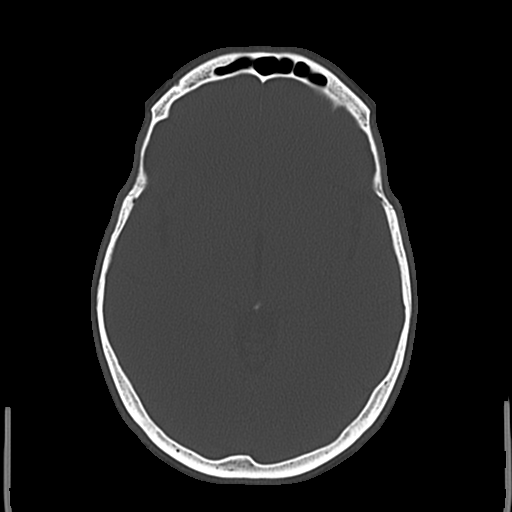
[im 28/50  bone]
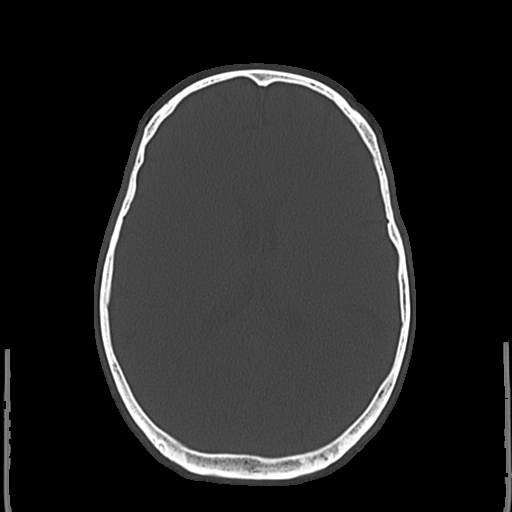
[im 33/50  bone]
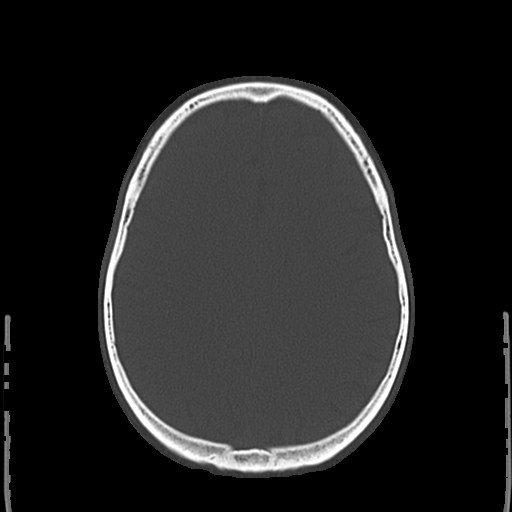
[im 39/50  bone]
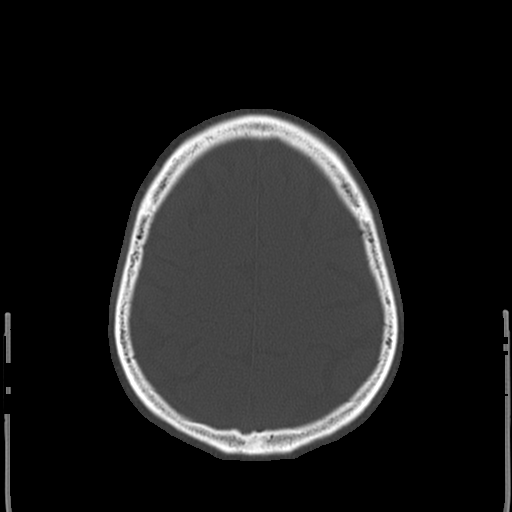
[im 44/50  bone]
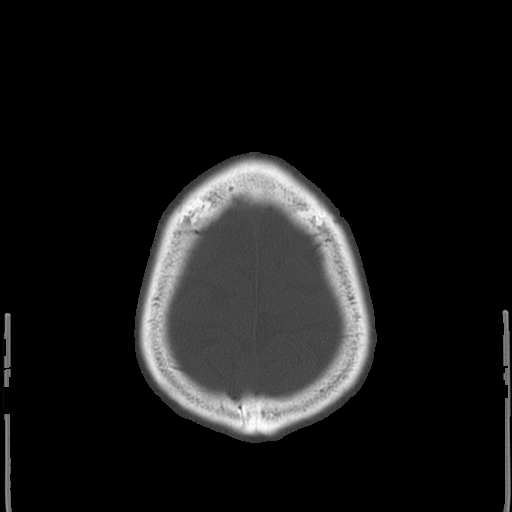

[17 of 30 positions shown; findings below may reference images not displayed]

FINDINGS: The ventricles are normal in size and configuration.

There are no parenchymal masses or mass effect. There is no evidence
of an infarct. There are no extra-axial masses or abnormal fluid
collections.

There is no intracranial hemorrhage.

There is mild inferior frontal and ethmoid sinus mucosal thickening.
Clear mastoid air cells. No skull fracture or lesion.
IMPRESSION: 1. No intracranial abnormality.
